# Patient Record
Sex: Male | Born: 1970 | Race: Black or African American | Hispanic: No | Marital: Single | Smoking: Current every day smoker
Health system: Southern US, Community
[De-identification: ages and names within clinical notes are randomized; demographics above are authoritative.]

## PROBLEM LIST (undated history)

## (undated) DIAGNOSIS — I5022 Chronic systolic (congestive) heart failure: Secondary | ICD-10-CM

## (undated) DIAGNOSIS — G4730 Sleep apnea, unspecified: Secondary | ICD-10-CM

## (undated) DIAGNOSIS — Z720 Tobacco use: Secondary | ICD-10-CM

## (undated) DIAGNOSIS — I428 Other cardiomyopathies: Secondary | ICD-10-CM

## (undated) DIAGNOSIS — I10 Essential (primary) hypertension: Secondary | ICD-10-CM

## (undated) DIAGNOSIS — J449 Chronic obstructive pulmonary disease, unspecified: Secondary | ICD-10-CM

## (undated) HISTORY — DX: Chronic systolic (congestive) heart failure: I50.22

## (undated) HISTORY — DX: Sleep apnea, unspecified: G47.30

## (undated) HISTORY — DX: Tobacco use: Z72.0

## (undated) HISTORY — DX: Essential (primary) hypertension: I10

## (undated) HISTORY — DX: Other cardiomyopathies: I42.8

## (undated) HISTORY — DX: Chronic obstructive pulmonary disease, unspecified: J44.9

## (undated) HISTORY — PX: KNEE ARTHROSCOPY: SUR90

---

## 2008-06-25 HISTORY — PX: CARDIAC CATHETERIZATION: SHX172

## 2012-03-21 ENCOUNTER — Other Ambulatory Visit: Payer: Self-pay | Admitting: Emergency Medicine

## 2012-03-21 DIAGNOSIS — R7989 Other specified abnormal findings of blood chemistry: Secondary | ICD-10-CM

## 2012-03-21 DIAGNOSIS — I5023 Acute on chronic systolic (congestive) heart failure: Secondary | ICD-10-CM

## 2012-03-27 ENCOUNTER — Telehealth: Payer: Self-pay

## 2012-03-27 ENCOUNTER — Encounter: Payer: Self-pay | Admitting: *Deleted

## 2012-03-27 NOTE — Telephone Encounter (Signed)
Needs to establish with PCP

## 2012-03-27 NOTE — Telephone Encounter (Signed)
TCM call

## 2012-03-27 NOTE — Telephone Encounter (Signed)
TCM call Pt reports feeling better since d/c from [facility] Was admitted for SOB Denies worsening sob or CP Confirms appt with [place] next week 3/10 Was given RX at d/c Went to have inhalers filled and this was going to cost [place] $600. He is unable to afford these Does not have PCP I will ask MD if there are any alternatives or if we need to make him an appt with a new PCP

## 2012-03-27 NOTE — Telephone Encounter (Signed)
Message copied by [facility], MELISSA E on Mon Mar 27, 2012 10:03 AM ------      Message from: Iverson Alamin C      Created: Mon Mar 27, 2012  9:38 AM       TCM      Discharged [facility]: 03/22/12      Appoint: [facility] 04/03/12      Records Printed. ------

## 2012-03-27 NOTE — Telephone Encounter (Signed)
Any ideas? 

## 2012-03-28 NOTE — Telephone Encounter (Signed)
Pt informed I offered to make him an appt with a PCP here He declines, says he sees a clinic on occasion in [place] and has an appt with them 3/11 Will discuss inhalers at that time He also declines sob today, says he feels well

## 2012-04-03 ENCOUNTER — Encounter: Payer: Self-pay | Admitting: Cardiovascular Disease

## 2012-04-05 ENCOUNTER — Encounter: Payer: Self-pay | Admitting: *Deleted

## 2012-04-24 ENCOUNTER — Encounter: Payer: Self-pay | Admitting: *Deleted

## 2012-04-25 ENCOUNTER — Ambulatory Visit (INDEPENDENT_AMBULATORY_CARE_PROVIDER_SITE_OTHER): Payer: Self-pay | Admitting: Cardiovascular Disease

## 2012-04-25 ENCOUNTER — Encounter: Payer: Self-pay | Admitting: Cardiovascular Disease

## 2012-04-25 VITALS — BP 136/90 | HR 75 | Ht 69.0 in | Wt 229.2 lb

## 2012-04-25 DIAGNOSIS — I5022 Chronic systolic (congestive) heart failure: Secondary | ICD-10-CM | POA: Insufficient documentation

## 2012-04-25 DIAGNOSIS — I10 Essential (primary) hypertension: Secondary | ICD-10-CM

## 2012-04-25 DIAGNOSIS — F17200 Nicotine dependence, unspecified, uncomplicated: Secondary | ICD-10-CM

## 2012-04-25 DIAGNOSIS — Z720 Tobacco use: Secondary | ICD-10-CM

## 2012-04-25 MED ORDER — HYDRALAZINE HCL 25 MG PO TABS: 25.0000 mg | ORAL_TABLET | Freq: Three times a day (TID) | ORAL | Status: AC

## 2012-04-25 MED ORDER — CARVEDILOL 12.5 MG PO TABS: 12.5000 mg | ORAL_TABLET | Freq: Two times a day (BID) | ORAL | Status: AC

## 2012-04-25 NOTE — Patient Instructions (Addendum)
Change Hydralazine to 25 mg three times daily.  Continue other medications.  Follow up in 3 months.

## 2012-04-25 NOTE — Progress Notes (Signed)
HPI  This is a 42 year old African American male who is here today for a followup visit after recent hospitalization at [facility]. He has known history of chronic systolic heart failure due to nonischemic cardiomyopathy. This was diagnosed in 2010. He had cardiac catheterization at that time which showed no significant coronary artery disease with an ejection fraction of 25-30%. He has known history of hypertensive heart disease with poor compliance to medications. He also has history of poly-substance abuse. He presented recently with shortness of breath and elevated blood pressure. He was found to have acute on chronic systolic heart failure. His cardiac enzymes were mildly elevated likely due to supply demand ischemia. The patient improved after resuming his medications and gentle diuresis. Since hospital discharge, he has been taking his medications regularly but ran out of Coreg 2 days ago. He denies any chest pain or dyspnea. He denies any active drug use.  No Known Allergies   Current Outpatient Prescriptions on File Prior to Visit  Medication Sig Dispense Refill  . carvedilol (COREG) 12.5 MG tablet Take 12.5 mg by mouth 2 (two) times daily with a meal.      . celecoxib (CELEBREX) 200 MG capsule Take 200 mg by mouth 2 (two) times daily.      . furosemide (LASIX) 40 MG tablet Take 40 mg by mouth daily.      . Ipratropium-Albuterol (COMBIVENT) 20-100 MCG/ACT AERS respimat Inhale into the lungs 4 (four) times daily.      . isosorbide mononitrate (IMDUR) 30 MG 24 hr tablet Take 30 mg by mouth daily.      . traMADol (ULTRAM) 50 MG tablet Take 50 mg by mouth 2 (two) times daily.       No current facility-administered medications on file prior to visit.     Past Medical History  Diagnosis Date  . Nonischemic cardiomyopathy     EF 25%-30%  . Sleep apnea   . COPD (chronic obstructive pulmonary disease)   . Chronic systolic heart failure     EF 09-81% due to nonischemic cardiomyopathy.  .  Hypertension   . Tobacco abuse      Past Surgical History  Procedure Laterality Date  . Knee arthroscopy Right   . Cardiac catheterization  06/2008    [facility]     Family History  Problem Relation Age of Onset  . Hypertension Mother      History   Social History  . Marital Status: Single    Spouse Name: N/A    Number of Children: N/A  . Years of Education: N/A   Occupational History  . Not on file.   Social History Main Topics  . Smoking status: Current Every Day Smoker -- 0.25 packs/day for 20 years    Types: Cigars  . Smokeless tobacco: Not on file  . Alcohol Use: Yes     Comment: occasional  . Drug Use: No  . Sexually Active: Not on file   Other Topics Concern  . Not on file   Social History Narrative  . No narrative on file     PHYSICAL EXAM   BP 136/90  Pulse 75  Ht 5\' 9"  (1.753 m)  Wt 229 lb 4 oz (103.987 kg)  BMI 33.84 kg/m2 Constitutional: He is oriented to person, place, and time. He appears well-developed and well-nourished. No distress.  HENT: No nasal discharge.  Head: Normocephalic and atraumatic.  Eyes: Pupils are equal and round. Right eye exhibits no discharge. Left eye exhibits  no discharge.  Neck: Normal range of motion. Neck supple. No JVD present. No thyromegaly present.  Cardiovascular: Normal rate, regular rhythm, normal heart sounds and. Exam reveals no gallop and no friction rub. No murmur heard.  Pulmonary/Chest: Effort normal and breath sounds normal. No stridor. No respiratory distress. He has no wheezes. He has no rales. He exhibits no tenderness.  Abdominal: Soft. Bowel sounds are normal. He exhibits no distension. There is no tenderness. There is no rebound and no guarding.  Musculoskeletal: Normal range of motion. He exhibits no edema and no tenderness.  Neurological: He is alert and oriented to person, place, and time. Coordination normal.  Skin: Skin is warm and dry. No rash noted. He is not diaphoretic. No erythema. No  pallor.  Psychiatric: He has a normal mood and affect. His behavior is normal. Judgment and thought content normal.       ZOX:[id number]  Rhythm  Voltage criteria for LVH  (R(V6) exceeds 2.26 mV).   -Old anteroseptal infarct.   -Nonspecific ST depression   +   T-abnormality  -Seen with left ventricular hypertrophy (strain) or digitalis effect.   ABNORMAL    ASSESSMENT AND PLAN

## 2012-04-25 NOTE — Assessment & Plan Note (Signed)
He appears to be euvolemic on current dose of furosemide. He ran out of Coreg 2 days ago. I had a prolonged discussion with him about the importance of taking his medications regularly. He was discharged home on Coreg but it appears that he is still taking metoprolol. Once he gets refill on Coreg, he is to stop metoprolol. I will switch hydralazine to 25 mg 3 times daily instead of 4 times daily in order to improve compliance. Once he establishes a followup and compliance with medications, we will consider repeating echocardiogram to evaluate LV systolic function.

## 2012-04-25 NOTE — Assessment & Plan Note (Signed)
I discussed with him the importance of smoking cessation. 

## 2012-04-25 NOTE — Assessment & Plan Note (Signed)
Blood pressure is reasonably controlled on current medications. I discussed with him the importance of low sodium diet.

## 2012-04-26 ENCOUNTER — Telehealth: Payer: Self-pay

## 2012-04-26 NOTE — Telephone Encounter (Signed)
Message copied by [facility], Janice Bodine E on Wed Apr 26, 2012  8:01 AM ------      Message from: Lorine Bears A      Created: Tue Apr 25, 2012  5:30 PM       I sent refills on Coreg. Once he gets this medication, he will need to stop taking Metoprolol.  ------

## 2012-04-26 NOTE — Telephone Encounter (Signed)
lmtcb

## 2012-04-26 NOTE — Telephone Encounter (Signed)
See below

## 2012-04-26 NOTE — Telephone Encounter (Signed)
Pt informed Understanding verb 

## 2012-07-21 ENCOUNTER — Ambulatory Visit: Payer: Self-pay | Admitting: Cardiovascular Disease

## 2012-07-31 ENCOUNTER — Ambulatory Visit: Payer: Self-pay | Admitting: Cardiovascular Disease

## 2012-08-18 ENCOUNTER — Ambulatory Visit: Payer: Self-pay | Admitting: Cardiovascular Disease

## 2012-12-01 ENCOUNTER — Other Ambulatory Visit: Payer: Self-pay | Admitting: Emergency Medicine

## 2012-12-01 DIAGNOSIS — J449 Chronic obstructive pulmonary disease, unspecified: Secondary | ICD-10-CM

## 2012-12-01 DIAGNOSIS — I214 Non-ST elevation (NSTEMI) myocardial infarction: Secondary | ICD-10-CM

## 2012-12-01 DIAGNOSIS — I059 Rheumatic mitral valve disease, unspecified: Secondary | ICD-10-CM

## 2012-12-01 DIAGNOSIS — I119 Hypertensive heart disease without heart failure: Secondary | ICD-10-CM

## 2012-12-01 DIAGNOSIS — F17200 Nicotine dependence, unspecified, uncomplicated: Secondary | ICD-10-CM

## 2012-12-01 DIAGNOSIS — I5043 Acute on chronic combined systolic (congestive) and diastolic (congestive) heart failure: Secondary | ICD-10-CM

## 2012-12-09 ENCOUNTER — Other Ambulatory Visit: Payer: Self-pay

## 2012-12-09 ENCOUNTER — Other Ambulatory Visit: Payer: Self-pay | Admitting: Emergency Medicine

## 2012-12-13 ENCOUNTER — Encounter: Payer: Self-pay | Admitting: *Deleted

## 2012-12-25 ENCOUNTER — Ambulatory Visit: Payer: Self-pay | Admitting: Cardiovascular Disease

## 2012-12-25 ENCOUNTER — Encounter: Payer: Self-pay | Admitting: *Deleted

## 2012-12-26 ENCOUNTER — Telehealth: Payer: Self-pay | Admitting: *Deleted

## 2012-12-26 NOTE — Telephone Encounter (Signed)
Called pt to ask him if he would like to reschedule his missed appt from 12/25/12. He said no not at his time. He has another md to see then he will call us if he wants to follow up.

## 2013-01-14 ENCOUNTER — Other Ambulatory Visit: Payer: Self-pay | Admitting: Family Medicine

## 2013-01-14 ENCOUNTER — Other Ambulatory Visit: Payer: Self-pay

## 2013-01-14 DIAGNOSIS — I469 Cardiac arrest, cause unspecified: Secondary | ICD-10-CM

## 2013-01-15 ENCOUNTER — Other Ambulatory Visit: Payer: Self-pay

## 2013-01-15 ENCOUNTER — Other Ambulatory Visit: Payer: Self-pay | Admitting: Family Medicine

## 2013-01-15 DIAGNOSIS — I059 Rheumatic mitral valve disease, unspecified: Secondary | ICD-10-CM

## 2013-01-15 DIAGNOSIS — I2589 Other forms of chronic ischemic heart disease: Secondary | ICD-10-CM

## 2013-01-15 DIAGNOSIS — R6521 Severe sepsis with septic shock: Secondary | ICD-10-CM

## 2013-01-15 DIAGNOSIS — I469 Cardiac arrest, cause unspecified: Secondary | ICD-10-CM

## 2013-01-15 DIAGNOSIS — A419 Sepsis, unspecified organism: Secondary | ICD-10-CM

## 2013-01-15 DIAGNOSIS — R6520 Severe sepsis without septic shock: Secondary | ICD-10-CM

## 2013-01-15 DIAGNOSIS — J9600 Acute respiratory failure, unspecified whether with hypoxia or hypercapnia: Secondary | ICD-10-CM

## 2013-01-16 ENCOUNTER — Other Ambulatory Visit: Payer: Self-pay | Admitting: Family Medicine

## 2013-01-16 DIAGNOSIS — S066X9A Traumatic subarachnoid hemorrhage with loss of consciousness of unspecified duration, initial encounter: Secondary | ICD-10-CM

## 2013-01-16 DIAGNOSIS — I5022 Chronic systolic (congestive) heart failure: Secondary | ICD-10-CM

## 2013-01-16 DIAGNOSIS — I469 Cardiac arrest, cause unspecified: Secondary | ICD-10-CM

## 2013-01-16 DIAGNOSIS — S066XAA Traumatic subarachnoid hemorrhage with loss of consciousness status unknown, initial encounter: Secondary | ICD-10-CM

## 2013-01-25 DEATH — deceased

## 2013-08-22 ENCOUNTER — Telehealth: Payer: Self-pay

## 2013-08-22 NOTE — Telephone Encounter (Signed)
Request from  Disability Determination Services , sent to [employer] on 08/22/2013.  Also mailed release form to pt  

## 2015-06-18 IMAGING — CT CT ANGIO CHEST-ABD-PELV
2 of 6 series · 11 of 36 positions shown, 15 images · IV contrast (isovue)
Comparison: None.

CLINICAL DATA: Sharp pain in the right side of the neck noted 2
days ago, now complaining of pain in both sides of the neck.
Evaluate for potential aortic dissection.

EXAM:
CT ANGIOGRAPHY CHEST, ABDOMEN AND PELVIS
TECHNIQUE: Multidetector CT imaging through the chest, abdomen and pelvis was
performed using the standard protocol during bolus administration of
intravenous contrast. Multiplanar reconstructed images including
MIPs were obtained and reviewed to evaluate the vascular anatomy.
CONTRAST:  100 mL of Isovue 370.

[Series 7: arterial · axial · arterial · 0.77mm/px · z∈[+423,+955]mm · 10 of 309 slices shown, 13 images]
[im 29/309  mediastinal]
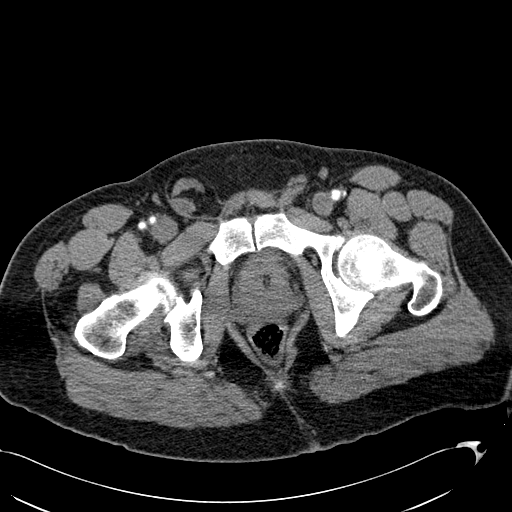
[im 29/309  bone]
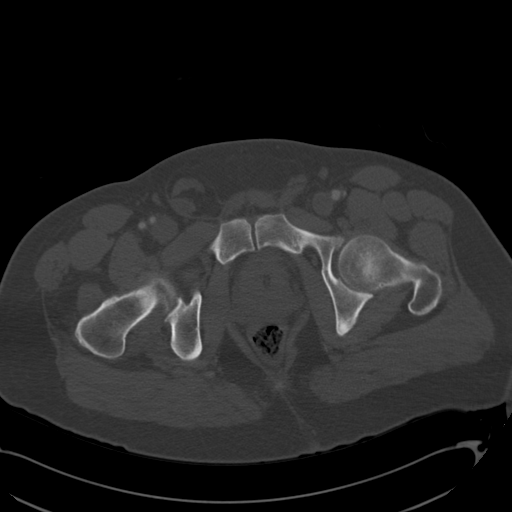
[im 57/309  mediastinal]
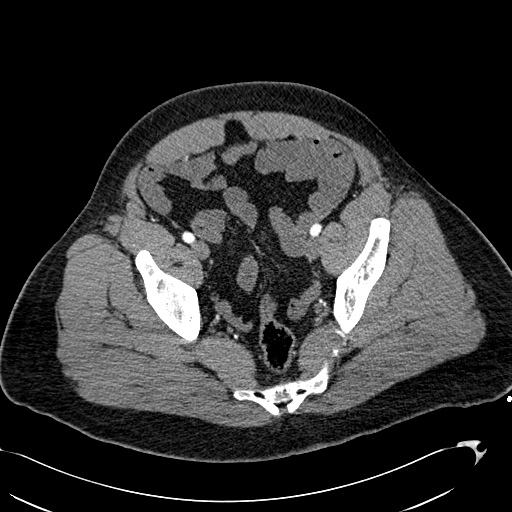
[im 99/309  mediastinal]
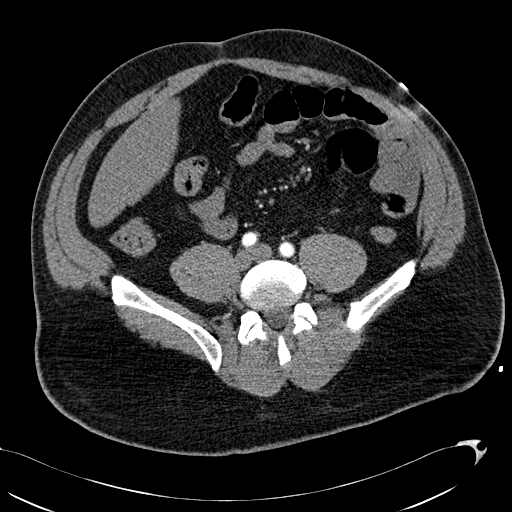
[im 141/309  mediastinal]
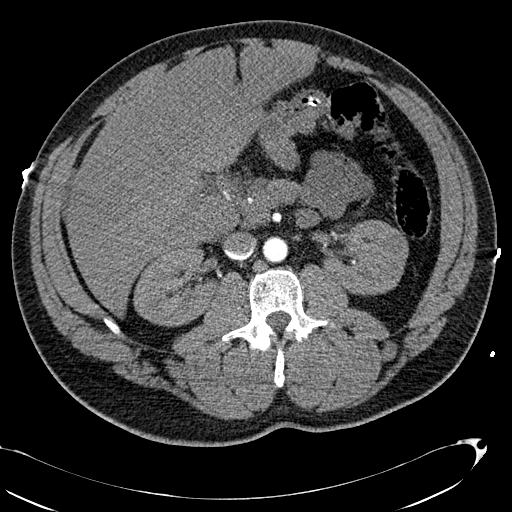
[im 169/309  mediastinal]
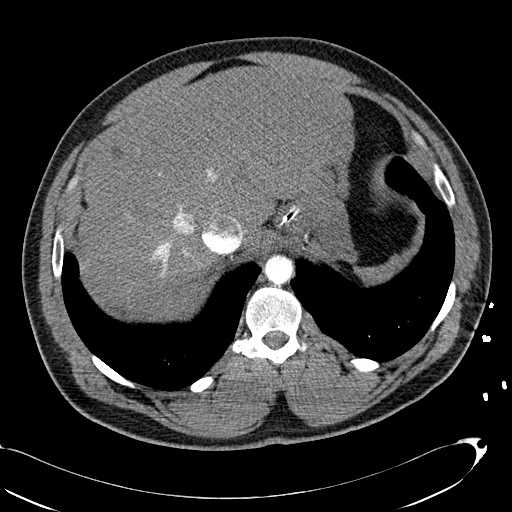
[im 211/309  mediastinal]
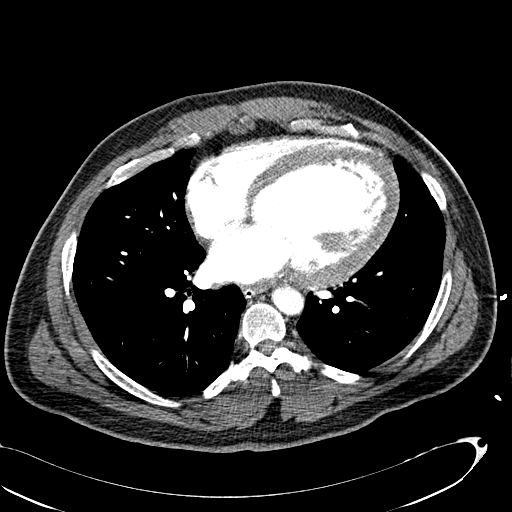
[im 253/309  mediastinal]
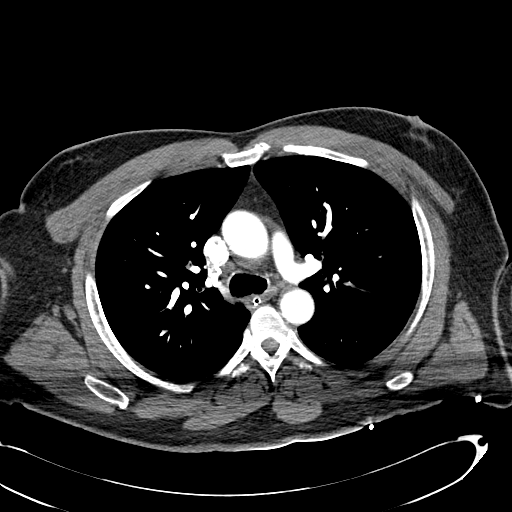
[im 253/309  lung]
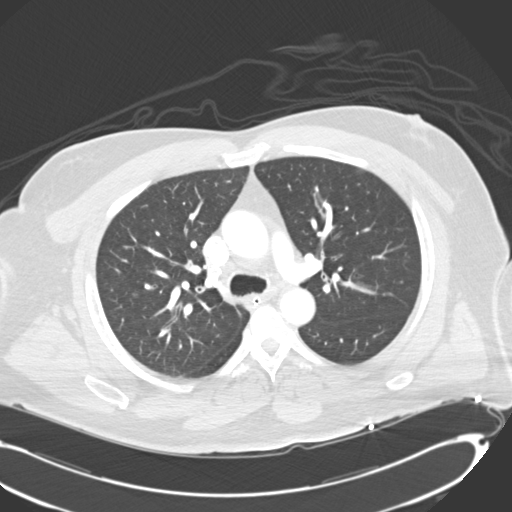
[im 267/309  lung]
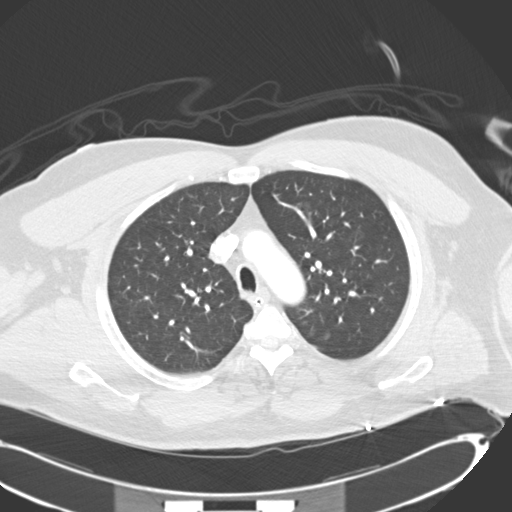
[im 281/309  mediastinal]
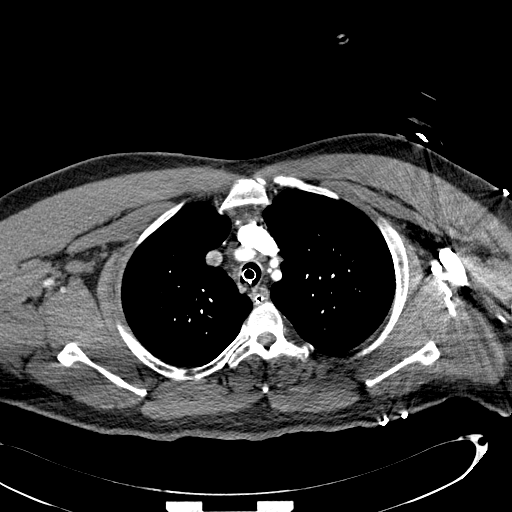
[im 281/309  lung]
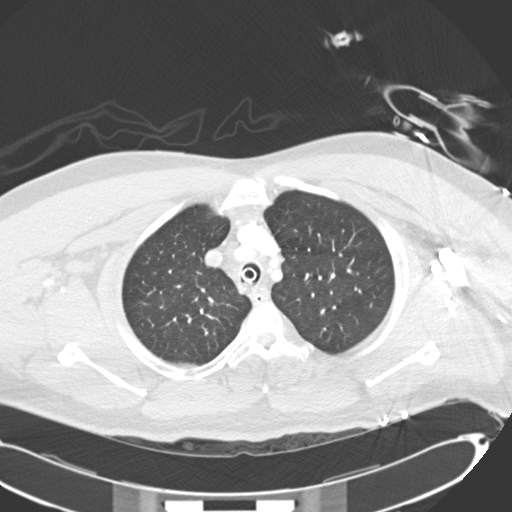
[im 295/309  lung]
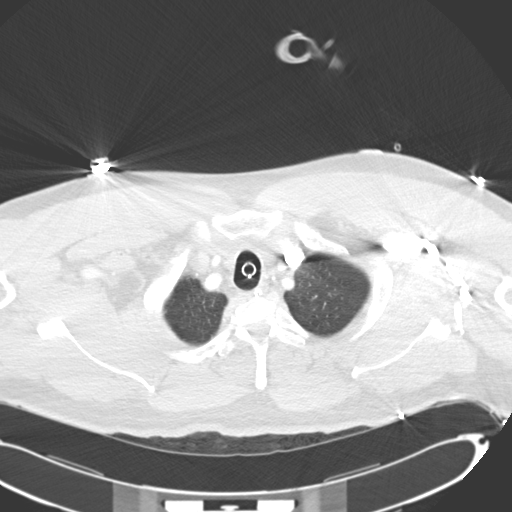

[Series 9: cor arterial mpr · coronal · arterial · 0.74mm/px · 1 of 166 slices shown, 2 images]
[im 83/166  mediastinal]
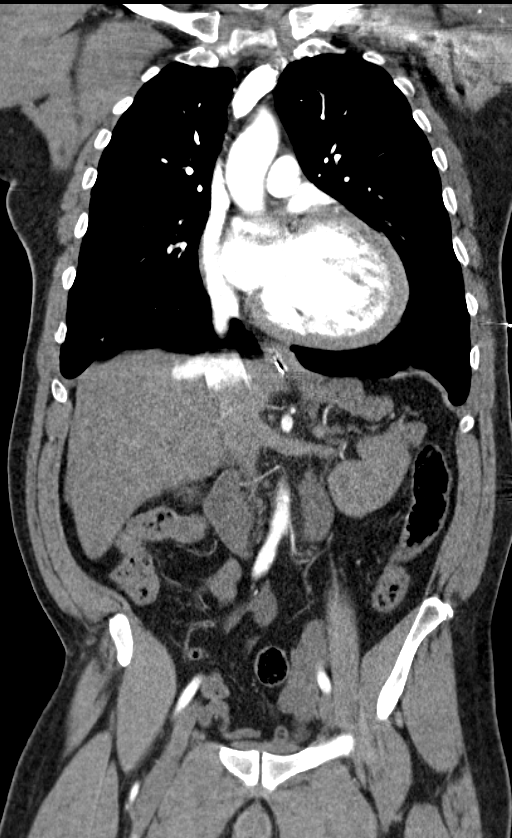
[im 83/166  bone]
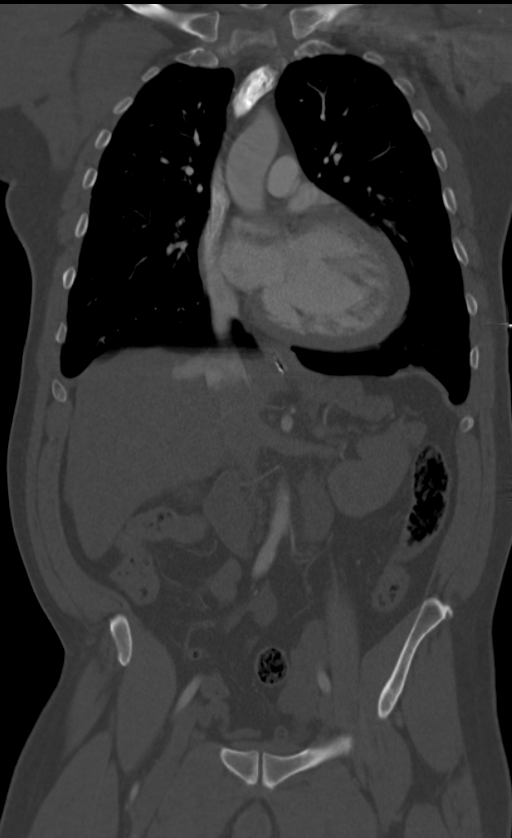

[11 of 36 positions shown; findings below may reference images not displayed]

FINDINGS: CTA CHEST FINDINGS

Mediastinum: Cardiomegaly with marked left ventricular dilatation.
There is no significant pericardial fluid, thickening or pericardial
calcification. Hypertrophy of the left ventricular myocardium
(estimated thickness of 18 mm in both the septum and lateral wall on
today's non gated images). No calcified plaque identified in the
coronary arteries. No atherosclerotic calcifications within the
thoracic aorta. Precontrast images demonstrate no crescentic high
attenuation associated with the wall of the thoracic aorta to
suggest acute intramural hemorrhage at this time. Post-contrast
images demonstrate no evidence of thoracic aortic aneurysm or
dissection. Separate origin of the left vertebral artery directly
off the aortic arch is incidentally noted. The patient is right
vertebral dominant. Visualized portions of the common carotid
arteries bilaterally, subclavian arteries and vertebral arteries are
unremarkable in appearance. The patient is intubated, with the tip
of the endotracheal tube in the trachea at a level shortly above the
aortic arch. Nasogastric tube extends into the stomach. Esophagus is
unremarkable in appearance. No pathologically enlarged mediastinal
or hilar lymph nodes.

Lungs/Pleura: No pneumothorax. No acute consolidative airspace
disease. No pleural effusions. In the right upper lobe there are
numerous small sub solid nodules clustered on images 24-30 of series
5, largest of which is located posteriorly measuring 1.2 x 1.7 cm
(with a central 10 mm solid component). This largest nodule may have
an internal focus of cavitation, best demonstrated on image 68 of
series 7.

Musculoskeletal: There are no aggressive appearing lytic or blastic
lesions noted in the visualized portions of the skeleton.

Review of the MIP images confirms the above findings.

CTA ABDOMEN AND PELVIS FINDINGS

Abdomen/Pelvis: No acute abnormality of the abdominal aorta or
pelvic vasculature. Specifically, No evidence of aneurysm or
dissection. Single renal arteries are noted bilaterally. The
superior and inferior mesenteric arteries are patent without
hemodynamically significant stenosis. Replaced left and right
hepatic arteries are noted arising from the proximal superior
mesenteric artery. A small common trunk arises from the proximal
abdominal aorta giving rise to the splenic artery and the left
gastric artery (in place of the celiac axis).

The gallbladder appears nearly completely decompressed. However,
there is diffuse gallbladder wall thickening. Trace volume of
ascites adjacent to the inferior aspect of the right lobe of the
liver. In the periphery of segment 8 of the liver there is an 11 mm
low-attenuation lesion, likely to represent a small cyst. The
appearance of the pancreas, bilateral adrenal glands and left kidney
is unremarkable. The spleen is diminutive in size, with a trace
volume of ascites adjacent to the spleen, but otherwise unremarkable
in appearance. In the lower pole of the right kidney there is a
cm low-attenuation lesion compatible with a small simple cyst.

No larger collection of ascites. No pneumoperitoneum. No pathologic
distention of small bowel. Normal appendix. No definite
lymphadenopathy identified within the abdomen or pelvis. Small right
inguinal hernia containing a short segment of small bowel, without
evidence of incarceration or obstruction at this time. Prostate
gland is unremarkable. A Foley balloon catheter is noted with tip in
the lumen of the urinary bladder which is completely decompressed.

Musculoskeletal: There are no aggressive appearing lytic or blastic
lesions noted in the visualized portions of the skeleton.

Review of the MIP images confirms the above findings.
IMPRESSION: 1. No acute radiographic abnormality of the thoracic or abdominal
aorta. Specifically, no evidence of aneurysm or dissection.
2. Additionally, the visualized portions of the great vessels of the
mediastinum are unremarkable in appearance.
3. Cardiomegaly with both left ventricular hypertrophy and
dilatation. Given the general lack of atherosclerotic disease noted
on today's examination, and a lack of coronary artery calcium in
particular, findings are suspicious for a nonischemic
cardiomyopathy. Clinical correlation is recommended.
4. Cluster of multiple sub solid nodules in the right upper lobe, as
discussed above. These are favored to be of infectious etiology,
particularly given evidence of early cavitation in one of the
nodules. However, attention on followup studies is recommended.
Initial follow-up by chest CT without contrast is recommended in 3
months to confirm persistence. This recommendation follows the
consensus statement: Recommendations for the Management of Subsolid
Pulmonary Nodules Detected at CT: A Statement from the [employer]
5. Additional incidental findings, as above.

## 2015-06-21 IMAGING — CR DG CHEST 1V PORT
1 series · 1 of 1 positions shown · non-contrast
Comparison: 01/16/2013

CLINICAL DATA: Ventilator dependent

EXAM:
PORTABLE CHEST - 1 VIEW

[ap]
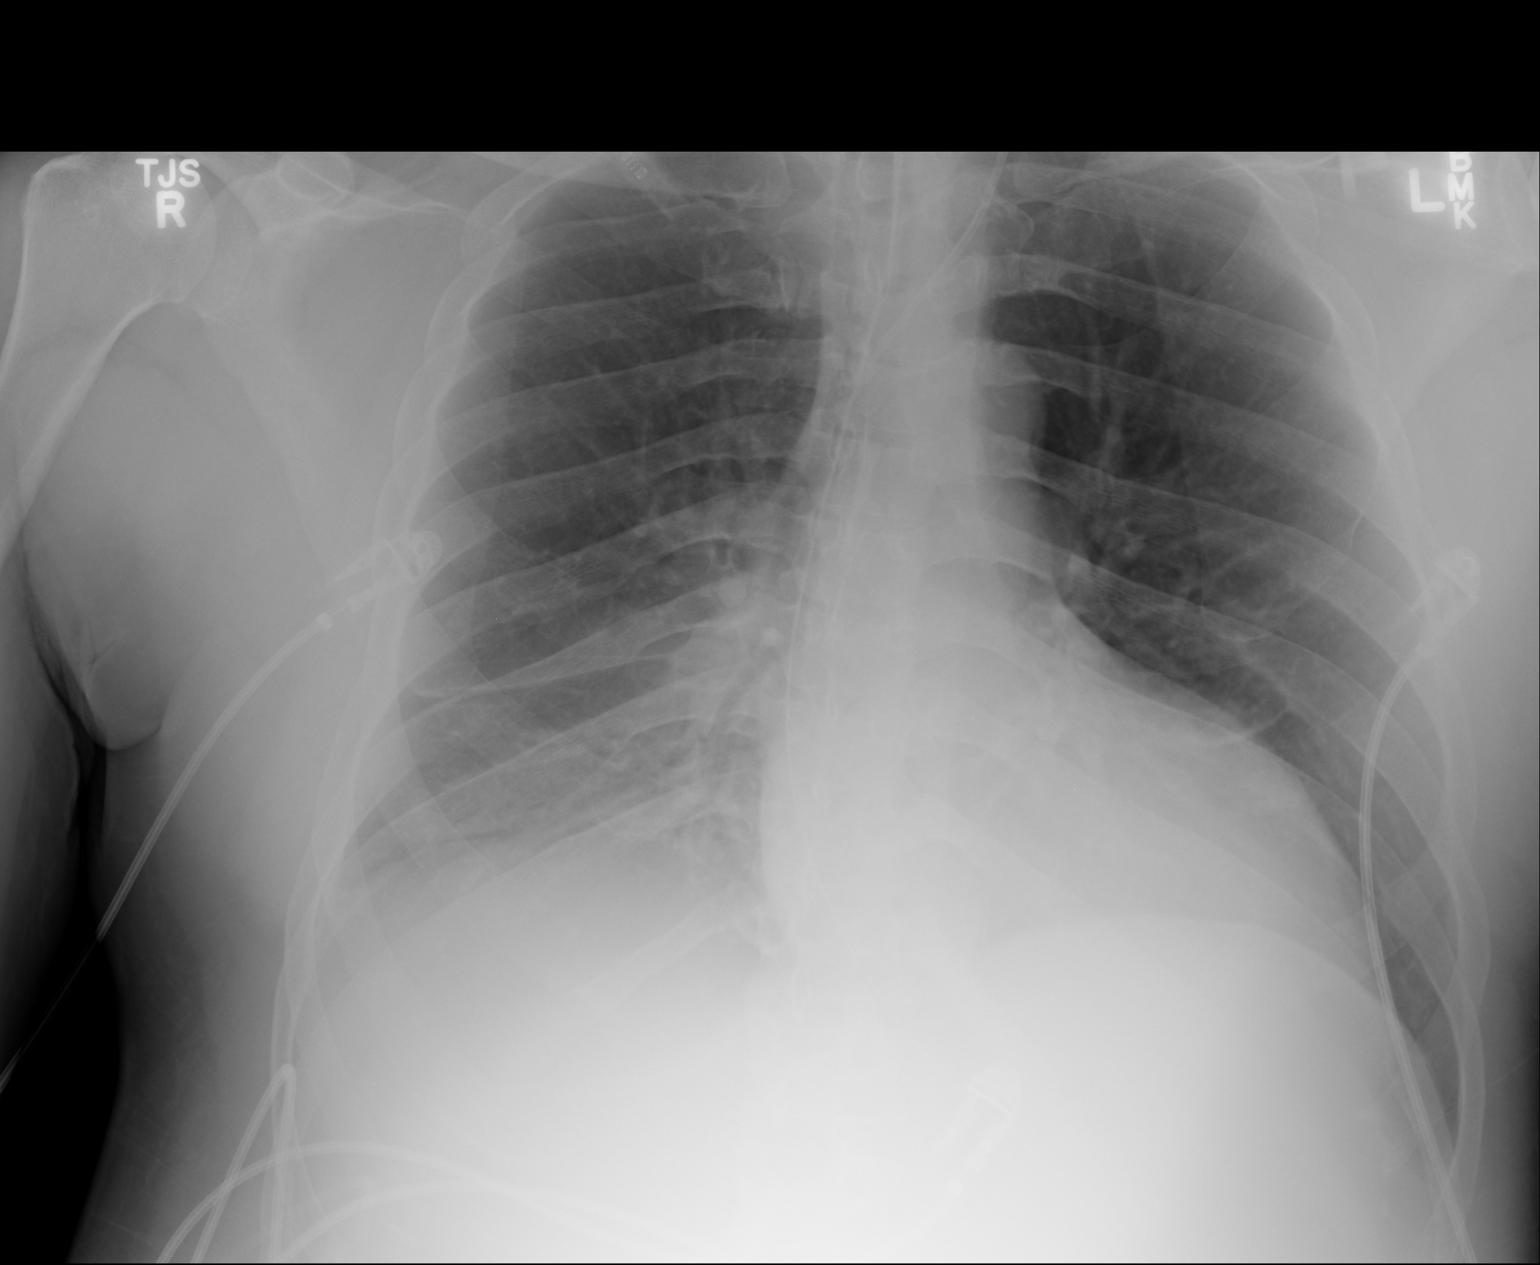

[1 of 1 positions shown; findings below may reference images not displayed]

FINDINGS: Cardiac shadow is within normal limits. A left-sided jugular central
line is noted at the junction of the innominate vein and superior
vena cava. An endotracheal tube is noted 5.3 cm above the Carina and
within normal limits. The nasogastric catheter extends into the
stomach. The left lung remains clear. Increasing right basilar
infiltrate with volume loss is noted. No other focal abnormality is
seen.
IMPRESSION: Increasing right basilar infiltrate.
